# Patient Record
Sex: Male | Born: 1950 | Race: Black or African American | Hispanic: No | Smoking: Current every day smoker
Health system: Southern US, Community
[De-identification: ages and names within clinical notes are randomized; demographics above are authoritative.]

---

## 2014-08-20 ENCOUNTER — Ambulatory Visit: Payer: Self-pay | Admitting: Unknown Physician Specialty

## 2014-08-23 ENCOUNTER — Ambulatory Visit: Payer: Self-pay | Admitting: Surgery

## 2014-12-02 NOTE — Op Note (Signed)
PATIENT NAME:  Brandon Waters, Brandon Waters MR#:  [id number] DATE OF BIRTH:  October 10, 1950  DATE OF PROCEDURE:  08/23/2014  PREOPERATIVE DIAGNOSIS: Left inguinal hernia.   POSTOPERATIVE DIAGNOSIS: Left inguinal hernia.   PROCEDURE: Left inguinal hernia repair.   SURGEON: Adella HareJ. Wilton Schuh, MD   ANESTHESIA: General.   INDICATIONS: This 64 year old male has recently been having bulging in the left groin, and a left inguinal hernia was demonstrated on physical exam and repair recommended for definitive treatment.   DESCRIPTION OF PROCEDURE: The patient was placed on the operating table in the supine position under general anesthesia. The left lower quadrant was clipped and prepared with ChloraPrep and draped in a sterile manner. A left lower quadrant transversely oriented suprapubic incision was made and carried down through subcutaneous tissues. A number of small veins were divided with electrocautery. Dissection was carried down to incise the Scarpa fascia and subsequently carried down to the external oblique aponeurosis. The external ring was identified. The aponeurosis was incised along the course of its fibers to open the external ring and expose the inguinal cord structures. There was a direct hernia sac found medial and inferior to the cord structures and dissected free from surrounding structures. There was no indirect component. There was a circumferential incision made in the attenuated transversalis fascia, which was excised. Next, the sac was further demonstrated and was approximately 6 cm in length. It was opened. Its continuity with the peritoneal cavity was demonstrated. There was no bowel within the sac. A high ligation of the sac was done with a 0 Surgilon suture ligature. The sac was excised and the stump allowed to retract. The sac was not submitted for pathology. The repair was carried out with a row of 0 Surgilon sutures, suturing conjoined tendon to the shelving edge of the inguinal ligament,  incorporating attenuated transversalis fascia into the repair, obliterating the defect. Next, an onlay Bard soft mesh was cut to create an oval shape of some 3 x 5 cm, and a notch was cut out to straddle the internal ring. The mesh was placed over the repair and sutured to the repair with 0 Surgilon, also sutured on both sides of the internal ring, and sutured medially to the deep fascia. The repair looked good. Hemostasis was intact. Cord structures were replaced along the floor of the inguinal canal. Cut edges of the external oblique aponeurosis were closed with running 4-0 Vicryl. The deep fascia, superior and lateral to the repair site, was infiltrated with 0.5% Sensorcaine with epinephrine. Subcutaneous tissues were infiltrated. The Scarpa fascia was closed with interrupted 4-0 Monocryl. The skin was closed with running 4-0 Monocryl subcuticular suture and LiquiBand. The testicle remained in the scrotum. The patient appeared to tolerate the procedure satisfactorily and was then prepared for transfer to the recovery room.   ____________________________ Shela CommonsJ. Renda RollsWilton Spain, MD [email or web address] D: 08/23/2014 10:57:44 ET T: 08/23/2014 12:10:44 ET JOB#: [id number]  cc: Adella HareJ. Wilton Lefeber, MD, <Dictator> Adella HareWILTON J Devery MD ELECTRONICALLY SIGNED 08/28/2014 18:34

## 2017-03-11 ENCOUNTER — Other Ambulatory Visit: Payer: Self-pay | Admitting: Family Medicine

## 2017-03-11 DIAGNOSIS — Z0000 Encounter for general adult medical examination without abnormal findings: Secondary | ICD-10-CM

## 2017-03-11 DIAGNOSIS — F17200 Nicotine dependence, unspecified, uncomplicated: Secondary | ICD-10-CM

## 2017-03-15 ENCOUNTER — Telehealth: Payer: Self-pay | Admitting: *Deleted

## 2017-03-15 NOTE — Telephone Encounter (Signed)
Received referral for low dose lung cancer screening CT scan. Attempted to leave message at phone number listed in EMR for patient to call me back to facilitate scheduling scan. However, this option is not available. Will attempt to contact at later date.  

## 2017-03-17 ENCOUNTER — Telehealth: Payer: Self-pay | Admitting: *Deleted

## 2017-03-17 ENCOUNTER — Ambulatory Visit
Admission: RE | Admit: 2017-03-17 | Discharge: 2017-03-17 | Disposition: A | Discharge status: Home / Self Care | Payer: Medicare Other | Source: Ambulatory Visit | Attending: Family Medicine | Admitting: Family Medicine

## 2017-03-17 DIAGNOSIS — Z136 Encounter for screening for cardiovascular disorders: Secondary | ICD-10-CM | POA: Diagnosis not present

## 2017-03-17 DIAGNOSIS — I77811 Abdominal aortic ectasia: Secondary | ICD-10-CM | POA: Insufficient documentation

## 2017-03-17 DIAGNOSIS — F17200 Nicotine dependence, unspecified, uncomplicated: Secondary | ICD-10-CM | POA: Diagnosis not present

## 2017-03-17 DIAGNOSIS — Z0000 Encounter for general adult medical examination without abnormal findings: Secondary | ICD-10-CM | POA: Diagnosis present

## 2017-03-17 NOTE — Telephone Encounter (Signed)
Received referral for initial lung cancer screening scan. Contacted patient who requests a morning screening apt. Will contact in near future when am apt is available.

## 2017-03-25 ENCOUNTER — Telehealth: Payer: Self-pay | Admitting: *Deleted

## 2017-03-25 NOTE — Telephone Encounter (Signed)
Received referral for low dose lung cancer screening CT scan. Message left at phone number listed in EMR for patient to call me back to facilitate scheduling scan.  

## 2017-03-26 ENCOUNTER — Telehealth: Payer: Self-pay | Admitting: *Deleted

## 2017-03-26 DIAGNOSIS — Z87891 Personal history of nicotine dependence: Secondary | ICD-10-CM

## 2017-03-26 DIAGNOSIS — Z122 Encounter for screening for malignant neoplasm of respiratory organs: Secondary | ICD-10-CM

## 2017-03-26 NOTE — Telephone Encounter (Signed)
Received referral for initial lung cancer screening scan. Contacted patient and obtained smoking history,(current, 48 pack year) as well as answering questions related to screening process. Patient denies signs of lung cancer such as weight loss or hemoptysis. Patient denies comorbidity that would prevent curative treatment if lung cancer were found. Patient is scheduled for shared decision making visit and CT scan on 04/23/17.

## 2017-04-07 ENCOUNTER — Encounter: Payer: Self-pay | Admitting: *Deleted

## 2017-04-07 ENCOUNTER — Ambulatory Visit: Payer: Medicare Other

## 2017-04-22 ENCOUNTER — Encounter: Payer: Self-pay | Admitting: Oncology

## 2017-04-23 ENCOUNTER — Inpatient Hospital Stay: Payer: Medicare Other | Attending: Oncology | Admitting: Oncology

## 2017-04-23 ENCOUNTER — Ambulatory Visit
Admission: RE | Admit: 2017-04-23 | Discharge: 2017-04-23 | Disposition: A | Discharge status: Home / Self Care | Payer: Medicare Other | Source: Ambulatory Visit | Attending: Oncology | Admitting: Oncology

## 2017-04-23 DIAGNOSIS — Z87891 Personal history of nicotine dependence: Secondary | ICD-10-CM

## 2017-04-23 DIAGNOSIS — I700 Atherosclerosis of aorta: Secondary | ICD-10-CM | POA: Insufficient documentation

## 2017-04-23 DIAGNOSIS — Z122 Encounter for screening for malignant neoplasm of respiratory organs: Secondary | ICD-10-CM | POA: Insufficient documentation

## 2017-04-23 DIAGNOSIS — F17210 Nicotine dependence, cigarettes, uncomplicated: Secondary | ICD-10-CM | POA: Diagnosis not present

## 2017-04-23 DIAGNOSIS — J439 Emphysema, unspecified: Secondary | ICD-10-CM | POA: Diagnosis not present

## 2017-04-23 NOTE — Progress Notes (Signed)
In accordance with CMS guidelines, patient has met eligibility criteria including age, absence of signs or symptoms of lung cancer.  Social History  Substance Use Topics  . Smoking status: Current Every Day Smoker    Packs/day: 1.00    Years: 48.00  . Smokeless tobacco: Not on file  . Alcohol use Not on file     A shared decision-making session was conducted prior to the performance of CT scan. This includes one or more decision aids, includes benefits and harms of screening, follow-up diagnostic testing, over-diagnosis, false positive rate, and total radiation exposure.  Counseling on the importance of adherence to annual lung cancer LDCT screening, impact of co-morbidities, and ability or willingness to undergo diagnosis and treatment is imperative for compliance of the program.  Counseling on the importance of continued smoking cessation for former smokers; the importance of smoking cessation for current smokers, and information about tobacco cessation interventions have been given to patient including [place] and [employer] quit [place] programs.  Written order for lung cancer screening with LDCT has been given to the patient and any and all questions have been answered to the best of my abilities.   Yearly follow up will be coordinated by Burgess Estelle, Thoracic Navigator.  Faythe Casa, NP 04/23/2017 8:30 AM

## 2017-04-26 ENCOUNTER — Telehealth: Payer: Self-pay | Admitting: *Deleted

## 2017-04-26 NOTE — Telephone Encounter (Signed)
Notified patient of LDCT lung cancer screening program results with recommendation for 3 month follow up imaging. Also notified of incidental findings noted below and is encouraged to discuss further with PCP who will receive a copy of this note and/or the CT report. Patient verbalizes understanding.   IMPRESSION: Lung-RADS 4A, suspicious. Follow up low-dose chest CT without contrast in 3 months (please use the following order, "CT CHEST LCS NODULE FOLLOW-UP W/O CM") is recommended. Alternatively, PET may be considered.  14.2 mm nodular opacity in the central left lower lobe, poorly visualized/evaluated.  Aortic Atherosclerosis (ICD10-I70.0) and Emphysema (ICD10-J43.9).

## 2017-07-21 ENCOUNTER — Telehealth: Payer: Self-pay | Admitting: *Deleted

## 2017-07-21 NOTE — Telephone Encounter (Signed)
Attempted to contact to coordinate the recommended lung screening follow up imaging. However there is no voicemail option at the # from the EMR for the patient. Will try at a later date.

## 2017-07-28 ENCOUNTER — Telehealth: Payer: Self-pay | Admitting: *Deleted

## 2017-07-28 NOTE — Telephone Encounter (Signed)
Attempted to leave message for patient to notify them that it is time to schedule low dose lung cancer screening CT scan follow up imaging. However, there is no option to leave voicemail. Will attempt to contact at later date.

## 2017-08-02 ENCOUNTER — Telehealth: Payer: Self-pay | Admitting: *Deleted

## 2017-08-02 NOTE — Telephone Encounter (Signed)
Have not been able to contact patient to arrange for recommended lung screening short term follow up scan. Message left for emergency contact. Will follow.

## 2017-08-16 ENCOUNTER — Telehealth: Payer: Self-pay | Admitting: *Deleted

## 2017-08-16 NOTE — Telephone Encounter (Signed)
Attempted to call regarding his recommended short term followup of lung screening CT scan. No voicemail option is available. Will mail notification.

## 2017-08-26 ENCOUNTER — Encounter: Payer: Self-pay | Admitting: *Deleted

## 2017-09-07 ENCOUNTER — Telehealth: Payer: Self-pay | Admitting: *Deleted

## 2017-09-07 NOTE — Telephone Encounter (Signed)
Attempted to contact regarding lung screening scan. No voicemail option is available.

## 2017-10-14 ENCOUNTER — Telehealth: Payer: Self-pay | Admitting: *Deleted

## 2017-10-14 DIAGNOSIS — R918 Other nonspecific abnormal finding of lung field: Secondary | ICD-10-CM

## 2017-10-14 DIAGNOSIS — Z122 Encounter for screening for malignant neoplasm of respiratory organs: Secondary | ICD-10-CM

## 2017-10-14 DIAGNOSIS — Z87891 Personal history of nicotine dependence: Secondary | ICD-10-CM

## 2017-10-14 NOTE — Telephone Encounter (Signed)
Notified patient that lung cancer screening CT scan is due currently or will be in near future. Confirmed that patient is within the age range of 55-77, and asymptomatic, (no signs or symptoms of lung cancer). Patient denies illness that would prevent curative treatment for lung cancer if found. Verified smoking history, (current, 49 pack year). The shared decision making visit was done 12/06/15. Patient is agreeable for CT scan being scheduled.

## 2017-10-21 ENCOUNTER — Ambulatory Visit: Payer: Medicare Other | Attending: Nurse Practitioner

## 2017-10-29 ENCOUNTER — Telehealth: Payer: Self-pay | Admitting: *Deleted

## 2017-10-29 NOTE — Telephone Encounter (Signed)
Unable to leave voicemail for patient regarding no show for lung screening scan recommended follow up imaging.

## 2017-11-01 ENCOUNTER — Telehealth: Payer: Self-pay | Admitting: *Deleted

## 2017-11-01 NOTE — Telephone Encounter (Signed)
Attempted again to contact patient via phone without success.

## 2017-12-10 ENCOUNTER — Telehealth: Payer: Self-pay | Admitting: *Deleted

## 2017-12-10 NOTE — Telephone Encounter (Signed)
After many attempts, spoke with patient on phone. Patient is not agreeable to have follow up CT scan as recommended. Reviewed very clearly the risk of not finding lung cancer at an early stage and offered patient lung screening follow up scan at no cost to him, (would utilize charitable funds for copay or deductible). Patient verbalizes understanding and refuses any further scans.

## 2018-04-27 ENCOUNTER — Other Ambulatory Visit: Payer: Self-pay | Admitting: Family Medicine

## 2018-04-27 DIAGNOSIS — F17200 Nicotine dependence, unspecified, uncomplicated: Secondary | ICD-10-CM

## 2018-04-27 DIAGNOSIS — Z13820 Encounter for screening for osteoporosis: Secondary | ICD-10-CM

## 2018-04-28 ENCOUNTER — Other Ambulatory Visit: Payer: Self-pay | Admitting: Family Medicine

## 2018-04-28 DIAGNOSIS — F17209 Nicotine dependence, unspecified, with unspecified nicotine-induced disorders: Secondary | ICD-10-CM

## 2018-04-28 DIAGNOSIS — J439 Emphysema, unspecified: Secondary | ICD-10-CM

## 2018-04-28 DIAGNOSIS — Z720 Tobacco use: Secondary | ICD-10-CM

## 2018-04-28 DIAGNOSIS — F17200 Nicotine dependence, unspecified, uncomplicated: Secondary | ICD-10-CM

## 2018-05-02 ENCOUNTER — Other Ambulatory Visit: Payer: Self-pay | Admitting: Family Medicine

## 2018-05-02 DIAGNOSIS — F17200 Nicotine dependence, unspecified, uncomplicated: Secondary | ICD-10-CM

## 2018-05-02 DIAGNOSIS — Z720 Tobacco use: Secondary | ICD-10-CM

## 2018-07-08 ENCOUNTER — Other Ambulatory Visit: Payer: Self-pay | Admitting: Family Medicine

## 2018-07-08 ENCOUNTER — Other Ambulatory Visit: Payer: Self-pay | Admitting: Nurse Practitioner

## 2018-07-08 DIAGNOSIS — R918 Other nonspecific abnormal finding of lung field: Secondary | ICD-10-CM

## 2018-07-08 DIAGNOSIS — Z87891 Personal history of nicotine dependence: Secondary | ICD-10-CM

## 2018-07-08 DIAGNOSIS — Z122 Encounter for screening for malignant neoplasm of respiratory organs: Secondary | ICD-10-CM

## 2018-07-15 ENCOUNTER — Ambulatory Visit
Admission: RE | Admit: 2018-07-15 | Discharge: 2018-07-15 | Disposition: A | Discharge status: Home / Self Care | Payer: Medicare Other | Source: Ambulatory Visit | Attending: Family Medicine | Admitting: Family Medicine

## 2018-07-15 DIAGNOSIS — Z87891 Personal history of nicotine dependence: Secondary | ICD-10-CM

## 2018-07-15 DIAGNOSIS — Z122 Encounter for screening for malignant neoplasm of respiratory organs: Secondary | ICD-10-CM | POA: Insufficient documentation

## 2018-07-15 DIAGNOSIS — R918 Other nonspecific abnormal finding of lung field: Secondary | ICD-10-CM | POA: Insufficient documentation

## 2018-07-18 ENCOUNTER — Encounter: Payer: Self-pay | Admitting: *Deleted

## 2018-10-10 IMAGING — CT CT CHEST LUNG CANCER SCREENING LOW DOSE W/O CM
1 of 3 series · 15 of 40 positions shown, 19 images · non-contrast
Comparison: None.

CLINICAL DATA: 66-year-old male current smoker, with 48 pack-year
history of smoking, for initial lung cancer screening

EXAM:
CT CHEST WITHOUT CONTRAST LOW-DOSE FOR LUNG CANCER SCREENING
TECHNIQUE: Multidetector CT imaging of the chest was performed following the
standard protocol without IV contrast.

[Series 4: lungs · axial · 0.75mm/px · z∈[-722,-419]mm · 15 of 335 slices shown, 19 images]
[im 16/335  mediastinal]
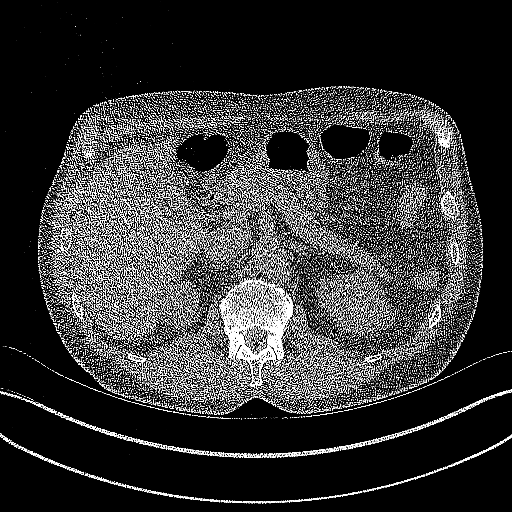
[im 16/335  lung]
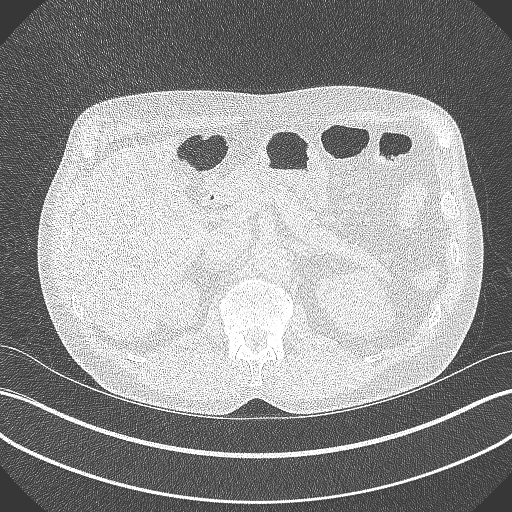
[im 46/335  lung]
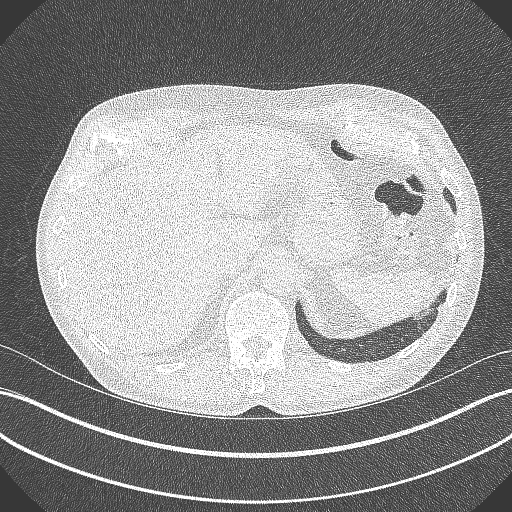
[im 76/335  lung]
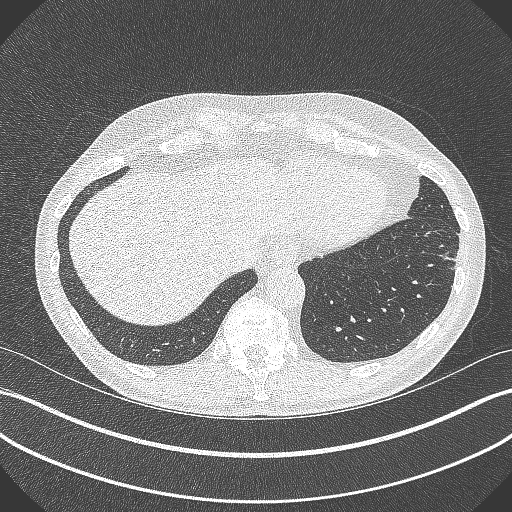
[im 92/335  lung]
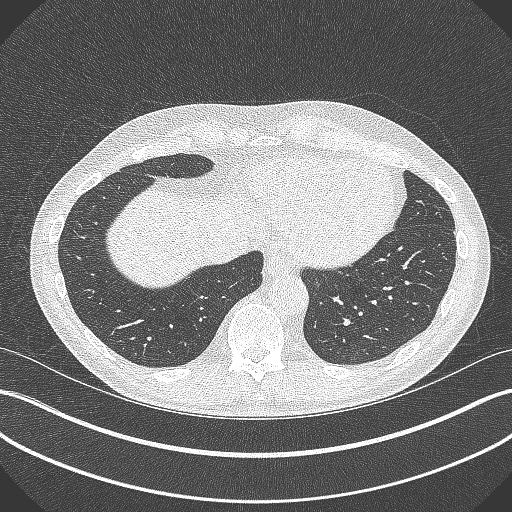
[im 112/335  mediastinal]
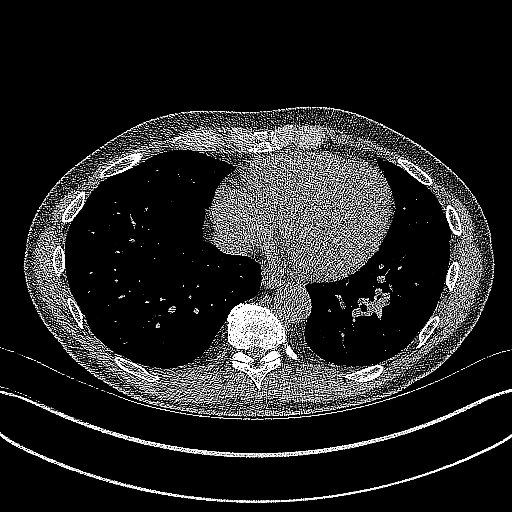
[im 112/335  lung]
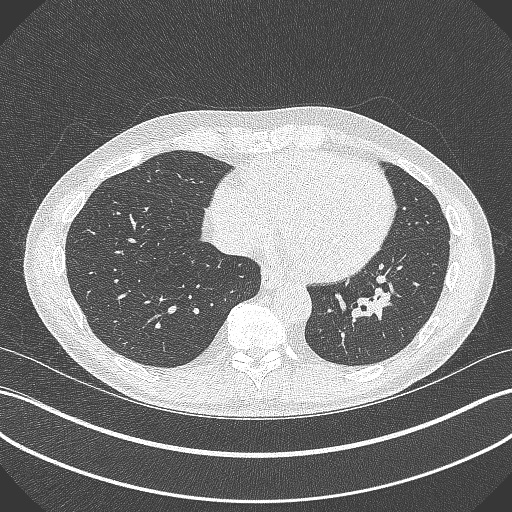
[im 122/335  lung]
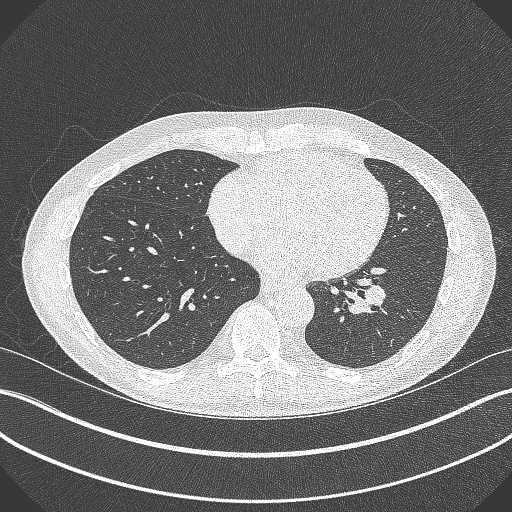
[im 152/335  lung]
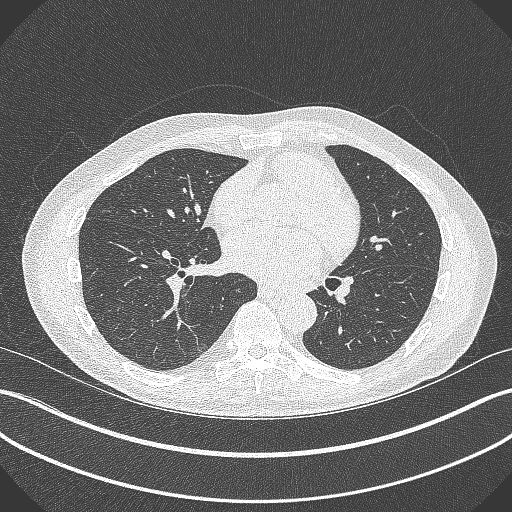
[im 168/335  lung]
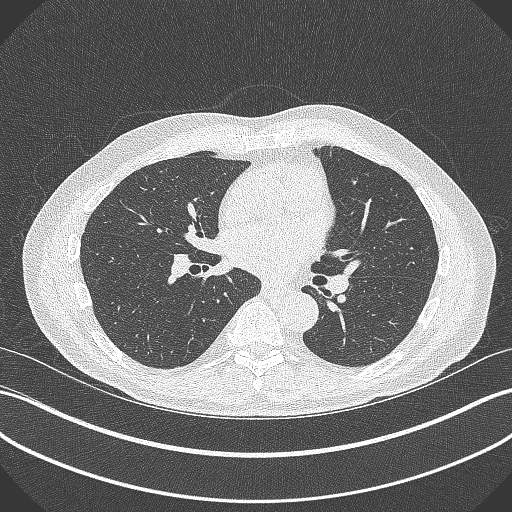
[im 183/335  mediastinal]
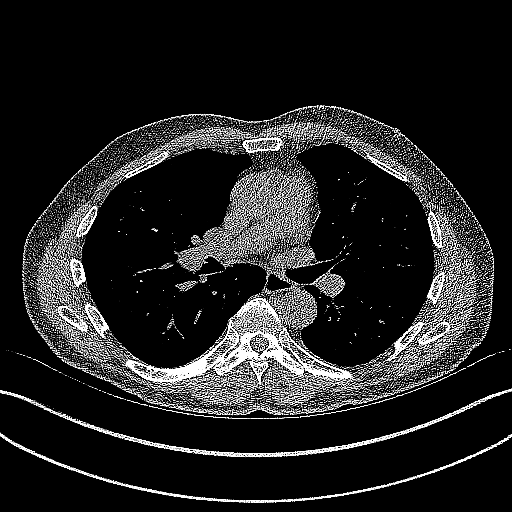
[im 183/335  lung]
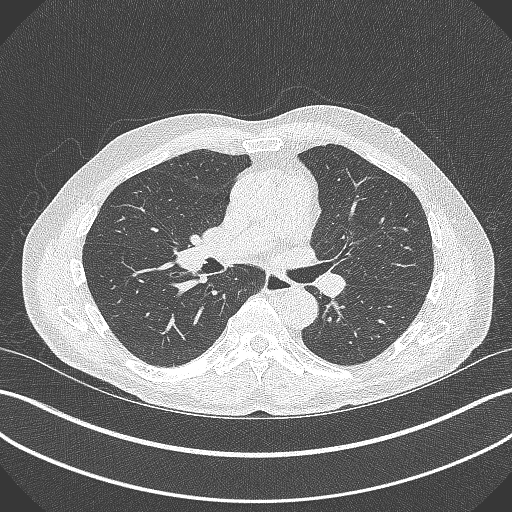
[im 213/335  lung]
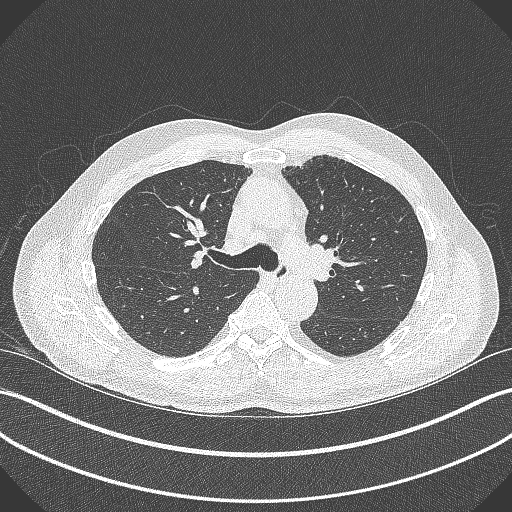
[im 223/335  lung]
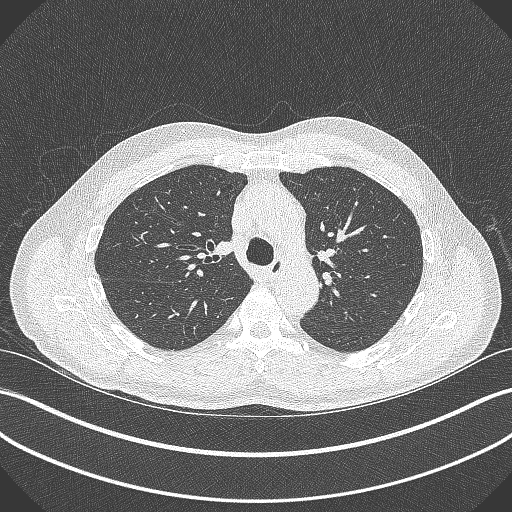
[im 243/335  lung]
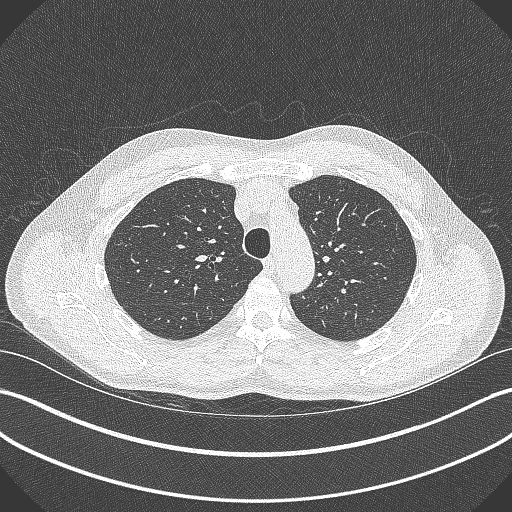
[im 274/335  mediastinal]
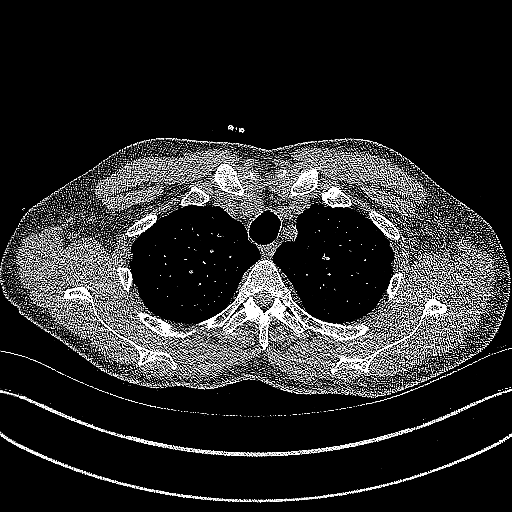
[im 274/335  lung]
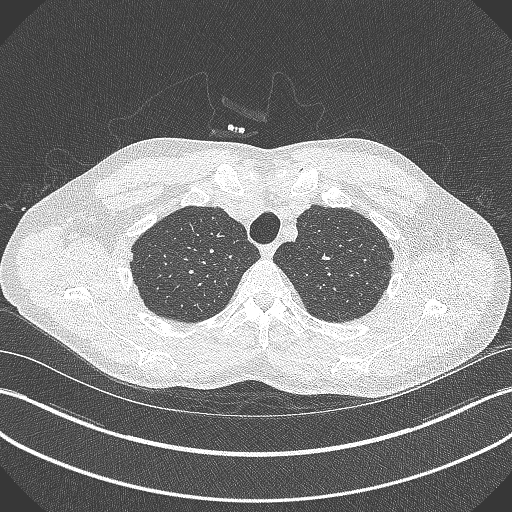
[im 289/335  lung]
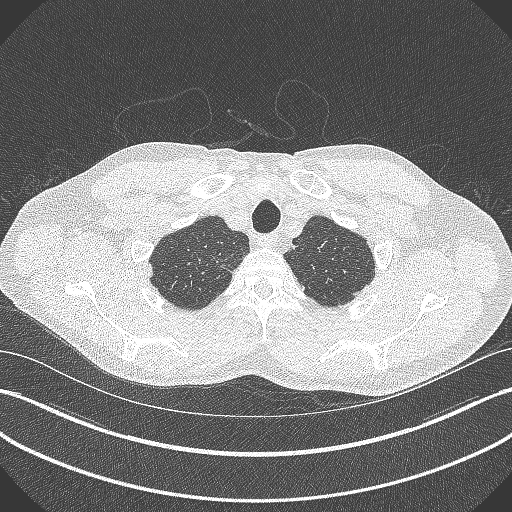
[im 319/335  lung]
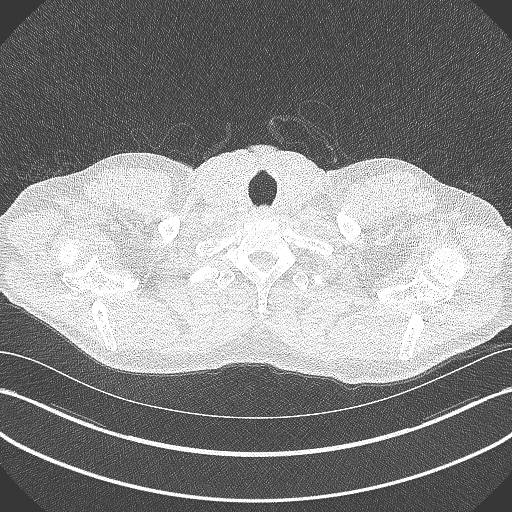

[15 of 40 positions shown; findings below may reference images not displayed]

FINDINGS: Cardiovascular: The heart is normal in size. No pericardial
effusion.

No evidence of thoracic aortic aneurysm. Very mild atherosclerotic
calcifications of the aortic arch.

Coronary atherosclerosis of the LAD.

Mediastinum/Nodes: Small mediastinal lymph nodes which do not meet
pathologic CT size criteria.

Visualized thyroid is notable for 11 mm right isthmic nodule.

Lungs/Pleura: Mild biapical pleural-parenchymal scarring.

Nodular opacity in the central left lower lobe measuring
approximately 14.2 mm (series 4/image 221), although surrounded by
multiple vessels, and therefore poorly evaluated.

17.1 mm subpleural ground-glass nodule in the posterior right lower
lobe.

Mild centrilobular emphysematous changes, upper lobe predominant.

No pleural effusion or pneumothorax.

Upper Abdomen: Visualized upper abdomen is unremarkable.

Musculoskeletal: Mild degenerative changes of the visualized
thoracolumbar spine.
IMPRESSION: Lung-RADS 4A, suspicious. Follow up low-dose chest CT without
contrast in 3 months (please use the following order, "CT CHEST LCS
NODULE FOLLOW-UP W/O CM") is recommended. Alternatively, PET may be
considered.

14.2 mm nodular opacity in the central left lower lobe, poorly
visualized/evaluated.

Aortic Atherosclerosis (KMBQZ-NK9.9) and Emphysema (KMBQZ-SQZ.C).

## 2019-07-18 ENCOUNTER — Telehealth: Payer: Self-pay | Admitting: *Deleted

## 2019-07-18 NOTE — Telephone Encounter (Signed)
Left a voicemail for Brandon Waters to notify him that it is time to schedule annual low dose lung cancer screening CT scan. Instructed patient to call back to verify information prior to the scan being scheduled.

## 2019-08-01 ENCOUNTER — Telehealth: Payer: Self-pay

## 2019-08-01 NOTE — Telephone Encounter (Signed)
Called patient to inform him that it is time to schedule his annual lung cancer screening CT scan. No answer and no option to leave a voicemail

## 2019-10-25 ENCOUNTER — Telehealth: Payer: Self-pay

## 2019-10-25 NOTE — Telephone Encounter (Signed)
Unsuccessful attempt at calling patient to notify them that it is time to schedule the low dose lung cancer screening CT scan. 

## 2019-11-07 ENCOUNTER — Telehealth: Payer: Self-pay

## 2019-11-07 NOTE — Telephone Encounter (Signed)
Unsuccessful attempt at calling patient to notify them that it is time to schedule the low dose lung cancer screening CT scan. 

## 2020-01-24 ENCOUNTER — Encounter: Payer: Self-pay | Admitting: *Deleted
